# Patient Record
Sex: Female | Born: 1975 | Race: White | Hispanic: Yes | Marital: Married | Smoking: Never smoker
Health system: Southern US, Community
[De-identification: ages and names within clinical notes are randomized; demographics above are authoritative.]

---

## 2004-10-22 ENCOUNTER — Ambulatory Visit: Payer: Self-pay | Admitting: Family Medicine

## 2004-10-22 ENCOUNTER — Ambulatory Visit (HOSPITAL_COMMUNITY): Admission: RE | Admit: 2004-10-22 | Discharge: 2004-10-22 | Payer: Self-pay | Admitting: Family Medicine

## 2004-10-22 ENCOUNTER — Ambulatory Visit: Payer: Self-pay | Admitting: *Deleted

## 2004-12-24 ENCOUNTER — Ambulatory Visit: Payer: Self-pay | Admitting: Family Medicine

## 2004-12-25 ENCOUNTER — Ambulatory Visit (HOSPITAL_COMMUNITY): Admission: RE | Admit: 2004-12-25 | Discharge: 2004-12-25 | Payer: Self-pay | Admitting: Family Medicine

## 2005-08-18 ENCOUNTER — Ambulatory Visit: Payer: Self-pay | Admitting: Internal Medicine

## 2005-09-10 ENCOUNTER — Ambulatory Visit: Payer: Self-pay | Admitting: *Deleted

## 2005-11-07 ENCOUNTER — Ambulatory Visit: Payer: Self-pay | Admitting: Family Medicine

## 2006-02-24 ENCOUNTER — Ambulatory Visit: Payer: Self-pay | Admitting: Family Medicine

## 2006-03-03 ENCOUNTER — Ambulatory Visit: Payer: Self-pay | Admitting: Internal Medicine

## 2006-07-07 ENCOUNTER — Ambulatory Visit: Payer: Self-pay | Admitting: Internal Medicine

## 2007-06-25 ENCOUNTER — Encounter: Payer: Self-pay | Admitting: Family Medicine

## 2007-06-25 ENCOUNTER — Ambulatory Visit: Payer: Self-pay | Admitting: Internal Medicine

## 2007-06-29 ENCOUNTER — Ambulatory Visit (HOSPITAL_COMMUNITY): Admission: RE | Admit: 2007-06-29 | Discharge: 2007-06-29 | Payer: Self-pay | Admitting: Family Medicine

## 2007-09-02 ENCOUNTER — Ambulatory Visit (HOSPITAL_COMMUNITY): Admission: RE | Admit: 2007-09-02 | Discharge: 2007-09-02 | Payer: Self-pay | Admitting: Family Medicine

## 2007-09-23 ENCOUNTER — Ambulatory Visit: Payer: Self-pay | Admitting: Internal Medicine

## 2008-05-12 ENCOUNTER — Emergency Department (HOSPITAL_COMMUNITY): Admission: EM | Admit: 2008-05-12 | Discharge: 2008-05-12 | Payer: Self-pay | Admitting: Emergency Medicine

## 2008-06-06 ENCOUNTER — Emergency Department (HOSPITAL_COMMUNITY): Admission: EM | Admit: 2008-06-06 | Discharge: 2008-06-06 | Payer: Self-pay | Admitting: Emergency Medicine

## 2009-07-30 ENCOUNTER — Ambulatory Visit: Payer: Self-pay | Admitting: Internal Medicine

## 2009-07-30 LAB — CONVERTED CEMR LAB: Chlamydia, Swab/Urine, PCR: NEGATIVE

## 2013-11-10 ENCOUNTER — Encounter: Payer: Self-pay | Admitting: Gynecology

## 2013-11-10 ENCOUNTER — Other Ambulatory Visit (HOSPITAL_COMMUNITY)
Admission: RE | Admit: 2013-11-10 | Discharge: 2013-11-10 | Disposition: A | Discharge status: Home / Self Care | Payer: Self-pay | Source: Ambulatory Visit | Attending: Gynecology | Admitting: Gynecology

## 2013-11-10 ENCOUNTER — Ambulatory Visit (INDEPENDENT_AMBULATORY_CARE_PROVIDER_SITE_OTHER): Payer: Self-pay | Admitting: Gynecology

## 2013-11-10 VITALS — BP 120/76 | Ht 58.5 in | Wt 131.0 lb

## 2013-11-10 DIAGNOSIS — Z01419 Encounter for gynecological examination (general) (routine) without abnormal findings: Secondary | ICD-10-CM

## 2013-11-10 DIAGNOSIS — N979 Female infertility, unspecified: Secondary | ICD-10-CM | POA: Insufficient documentation

## 2013-11-10 DIAGNOSIS — Z1151 Encounter for screening for human papillomavirus (HPV): Secondary | ICD-10-CM | POA: Insufficient documentation

## 2013-11-10 LAB — CBC WITH DIFFERENTIAL/PLATELET
BASOS ABS: 0 10*3/uL (ref 0.0–0.1)
Basophils Relative: 0 % (ref 0–1)
EOS PCT: 2 % (ref 0–5)
Eosinophils Absolute: 0.1 10*3/uL (ref 0.0–0.7)
HEMATOCRIT: 37.5 % (ref 36.0–46.0)
Hemoglobin: 12.9 g/dL (ref 12.0–15.0)
LYMPHS ABS: 2.1 10*3/uL (ref 0.7–4.0)
LYMPHS PCT: 36 % (ref 12–46)
MCH: 29.3 pg (ref 26.0–34.0)
MCHC: 34.4 g/dL (ref 30.0–36.0)
MCV: 85 fL (ref 78.0–100.0)
MONO ABS: 0.3 10*3/uL (ref 0.1–1.0)
Monocytes Relative: 5 % (ref 3–12)
NEUTROS PCT: 57 % (ref 43–77)
Neutro Abs: 3.4 10*3/uL (ref 1.7–7.7)
Platelets: 223 10*3/uL (ref 150–400)
RBC: 4.41 MIL/uL (ref 3.87–5.11)
RDW: 14.8 % (ref 11.5–15.5)
WBC: 5.9 10*3/uL (ref 4.0–10.5)

## 2013-11-10 LAB — COMPREHENSIVE METABOLIC PANEL
ALBUMIN: 4.2 g/dL (ref 3.5–5.2)
ALK PHOS: 59 U/L (ref 39–117)
ALT: 36 U/L — AB (ref 0–35)
AST: 21 U/L (ref 0–37)
BUN: 9 mg/dL (ref 6–23)
CHLORIDE: 104 meq/L (ref 96–112)
CO2: 25 meq/L (ref 19–32)
CREATININE: 0.48 mg/dL — AB (ref 0.50–1.10)
Calcium: 9.2 mg/dL (ref 8.4–10.5)
Glucose, Bld: 98 mg/dL (ref 70–99)
POTASSIUM: 3.9 meq/L (ref 3.5–5.3)
SODIUM: 136 meq/L (ref 135–145)
Total Bilirubin: 0.7 mg/dL (ref 0.2–1.2)
Total Protein: 6.8 g/dL (ref 6.0–8.3)

## 2013-11-10 LAB — LIPID PANEL
CHOL/HDL RATIO: 7.3 ratio
CHOLESTEROL: 174 mg/dL (ref 0–200)
HDL: 24 mg/dL — ABNORMAL LOW (ref >39)
LDL Cholesterol: 130 mg/dL — ABNORMAL HIGH (ref 0–99)
Triglycerides: 99 mg/dL (ref <150)
VLDL: 20 mg/dL (ref 0–40)

## 2013-11-10 MED ORDER — DOXYCYCLINE HYCLATE 100 MG PO CAPS: 100.0000 mg | ORAL_CAPSULE | Freq: Two times a day (BID) | ORAL | Status: AC

## 2013-11-10 NOTE — Addendum Note (Signed)
Addended by: Berna Spare A on: 11/10/2013 12:31 PM   Modules accepted: Orders

## 2013-11-10 NOTE — Patient Instructions (Addendum)
Anlisis de semen (Semen Analysis) Este anlisis se [place] para obtener informacin sobre la salud de sus rganos reproductores, en especial si su pareja tiene problemas para quedar embarazada, o despus de una vasectoma para determinar si la operacin result exitosa. El semen es la sustancia turbia y blancuzca que se libera del pene durante la eyaculacin. Los espermatozoides son clulas del semen que tienen una cabeza y una cola que les permite desplazarse hasta el vulo. Un espermatozoide contiene una copia de cada cromosoma (todos los genes masculinos) y se fusiona con el vulo femenino, y se produce la fertilizacin.  PREPARACIN PARA EL ESTUDIO Se recolectar Truddie Coco de semen en un recipiente estril de boca ancha provisto por el laboratorio. HALLAZGOS NORMALES  Volumen: 2 a 51m  Tiempo de licuefaccin: 20 a [id number] de la recoleccin  pH: 7,12 a 8,00  Recuento de espermatozoides (densidad): 50 a 2064mlones/ml  Movilidad de los espermatozoides: 60% al 80% con movilidad activa  Morfologa de los espermatozoides: 70% al 90% con forma normal Los rangos para los resultados normales pueden variar entre diferentes laboratorios y hospitales. Consulte siempre con su mdico despus de haPsychologist, counsellingstudio para coArmed forces logistics/support/administrative officerignificado de los reAugust si los valores se consideran "dentro de los lmites normales". SIGNIFICADO DEL ESTUDIO  El mdico leer los resultados y haElectrical engineeron usted sobre la importancia y el significado de los reWashington Boroas como de las opciones de tratamiento y la necesidad de reOptometristruebas adicionales, si fuera necesario. OBTENCIN DE LOS RESULTADOS DE LAS PRUEBAS Es su responsabilidad retirar el resultado del esGrottoesConsulte en el laboratorio cundo y cmo podr obThe TJX CompaniesDocument Released: 03/09/2013 ExB[facility] Specialty Hospitalatient Information 2014 ExBradfordLLMaineHisterosalpingografa (Hysterosalpingography) La histerosalpingografa es un  procedimiento para observar el interior del tero y las trompas de FaFairfaxDurante este procedimiento, se inyecta una sustancia de contraste en el tero, a travs de la vagina y el cuello uterino para poder visualizar el tero mientras se toman imgenes radiogrficas. Este procedimiento puede ayudar al mdico a diagnosticar tumores, adherencias o anormalidades estructurales en el tero. Generalmente se utSouth Georgia and the South Sandwich Islandsara determinar las razones por las que una mujer no puede tener hijos (infertilidad). El procedimiento suele durar entre 15 y 3063inutos. INFORME A SU MDICO:  Cualquier alergia que tenga.  Todos los meUAL CorporationtSouth Daytonincluyendo vitaminas, hierbas, gotas oftlmicas, cremas y medicamentos de venta libre.  Problemas previos que usted o los miUnitedHealthe su familia hayan tenido con el uso de anestsicos.  Enfermedades de laCampbell Soup Cirugas previas.  Padecimientos mdicos. RIESGOS Y COMPLICACIONES  Generalmente es un procedimiento seguro. Sin embargo, coGames developerrocedimiento, pueden surgir complicaciones. Las complicaciones posibles son:  Infeccin en la membrana que recubre interiormente al tero (endometritis) o en las trompas de Falopio (salpingitis).  DaCoffeerompas de FaRockford Reaccin alrgica a la sustancia de coMayotteara tomar la radiografa. ANTES DEL PROCEDIMIENTO   Debe planificar el procedimiento para despus que finalice su perodo, pero antes de su prxima ovulacin. Esto ocurre por lo general enRockwell Automation y 1023e su ltimo perodo. El Da 1 es elEngineer, manufacturing systemse su perodo.  Consulte a su mdico si debe cambiar o suspender los medicamentos que toma habitualmente.  Podr comer y beber normalmente.  Antes de comenzar el procedimiento vace la vejiga. PROCEDIMIENTO  Le administrarn un medicamento para relajarse (sedante) o un analgsico de venta libre para alPublic house manageras moGrovespring  procedimiento.  Deber acostarse en una mesa de radiografas con los pies en los estribos.  Le colocarn en la vagina un dispositivo llamado espculo. Esto permite al mdico observar el interior de la vagina hasta el cuello del tero.  El cuello del tero se lava con un jabn especial.  Luego se pasa un tubo delgado y flexible a travs del cuello del tero hasta el tero.  Por el tubo se colocar una sustancia de [place].  Le tomarn varias radiografas a medida que el contraste pasa a travs del tero y las trompas de [place].  Despus del procedimiento se retira el tubo. DESPUS DEL PROCEDIMIENTO   La mayor parte de la sustancia de contraste se eliminar naturalmente. Podra ser necesario que use un apsito sanitario.  Podr sentir clicos leves.  Pregunte cuando estarn listos los [employer] Insurance. Asegrese de The TJX Companies. Document Released: 08/11/2011 Document Revised: 01/19/2013 [facility] Patient Information 2014 [place], [place]. Infertilidad (Infertility) QU ES LA INFERTILIDAD?  La infertilidad normalmente se define como la incapacidad para quedar embarazada luego de un ao de relaciones sexuales regulares sin la utilizacin de mtodos anticonceptivos. O la incapacidad de llevar a trmino Nutritional therapist y Best boy el beb. La tasa de infertilidad en los [place] es de alrededor del 10%. El embarazo es el resultado de una cadena de sucesos. La mujer debe liberar el vulo de uno de sus ovarios (ovulacin). El vulo debe fertilizarse con el esperma. Luego viaja a travs de las trompas de Falopio hacia el tero (matriz), donde se une a la pared del tero y crece. El hombre debe tener suficiente esperma y el esperma debe unirse con el vulo (fertilizar) en el momento justo. El vulo fertilizado debe luego unirse al interior del tero. Esto parece simple, pero pueden ocurrir [employer] cosas que evitan que el embarazo se produzca.  DE QUIN ES EL PROBLEMA?  Un 20%  de los casos de infertilidad se deben a problemas del hombres (factores masculinos) y un 65% se debe a problemas de la mujer (factores femeninos). Otras causas pueden ser Ardelia Mems combinacin de factores masculinos y femeninos o a causas desconocidas.  CULES SON LAS CAUSAS DE [place]?  La infertilidad en el hombre a menudo se debe a problemas para producir el esperma o hacer que el mismo llegue al vulo. Los problemas de esperma pueden existir desde el nacimiento o desarrollarse ms tarde debido a enfermedades o lesiones. Algunos hombres no producen esperma, o producen muy poco (oligospermia). Entre otros problemas se incluyen: Disfuncin sexual Problemas hormonales o endocrinos. La edad. La fertilidad del hombre disminuye con la edad, pero no tan temprano como la femenina. Infecciones. Problemas congnitos Defectos de nacimiento, como ausencia de los tubos que transportan el esperma (conductos deferentes). Problemas genticos o de cromosomas. Problemas de anticuerpos antiesperma. Eyaculacin retrgrada (el esperma va hacia la vejiga). Varicoceles, espermatoceles o tumores en los testculos. El estilo de vida puede influir en el nmero y la calidad del esperma del hombre. El alcohol y las drogas pueden reducir temporalmente la calidad del esperma. Las toxinas ambientales, como los pesticidas y el plomo, pueden ocasionar algunos casos de infertilidad en hombres. CULES SON LAS CAUSAS DE LA INFERTILIDAD EN LA MUJER?  La infertilidad en las mujeres est causada mayormente por problemas con la ovulacin. Sin ovulacin, el vulo no puede fertilizarse. Seales de problemas de ovulacin son perodos menstruales irregulares o ningn perodo. Factores simples del estilo de vida, como el estrs, la [place], o el  entrenamiento deportivo, pueden afectar el balance hormonal de Arts administrator. La edad. La fertilidad comienza a Chiropractor alrededor de los 30 aos y [place] a Proofreader de  los 46. Mucho menos a menudo, un desequilibrio hormonal por un problema mdico serio como un tumor en la glndula pituitaria, tiroides u otra enfermedad mdica crnica pueden ocasionar problemas de ovulacin. Infecciones plvicas. Sndrome de ovarios poliqusticos (aumento de hormonas masculinas, incapacidad de ovular). Consumo de alcohol o drogas. Toxinas ambientales, radiacin, pesticidas y ciertos qumicos. La edad es un factor importante en la infertilidad femenina. La capacidad de los ovarios de una mujer para producir vulos disminuye con la edad, especialmente despus de los 71 aos. Alrededor de un tercio de las parejas en las que la mujer tiene ms de 35 aos tendr problemas de infertilidad. En el momento en el que alcanza la [place], cuando su perodo menstrual se detiene, la mujer ya no puede producir vulos ni quedar embarazada. Otros problemas tambin pueden llevar a la infertilidad en las mujeres. Si las trompas de [employer] estn bloqueadas en [employer] extremos, el vulo no puede pasar a travs de los tubos [place]. Tejido cicatrizal (adhesiones) en la pelvis que pueden obstruir las trompas. Esto puede dar como resultado una enfermedad inflamatoria plvica, endometriosis o una ciruga por embarazo ectpico (en la que el vulo fertilizado se ha implantado fuera del tero) o cualquier ciruga plvica o abdominal que ocasione adherencias. Tumores fibroides o plipos en el tero. Anormalidades congnitas (de nacimiento) del tero. Infecciones en el cuello del tero (cervicitis). Estenosis cervical (estrechamiento). Mucosidad cervical anormal. Sndrome poliqustico de los ovarios. Tener relaciones sexuales muy seguidas ([place] 4 a 5 veces por semana). Obesidad. Anorexia. Dficit nutricional. Mucho ejercicio, con prdida de grasa corporal. DES. Su madre ha recibido la hormona dietilstilbesterol cuando estaba embarazada de usted. CMO SE ANALIZA LA INFERTILIDAD?   Si ha estado tratando de quedar embarazada sin xito, podra querer buscar ayuda mdica. No debera esperar un ao de intentos sin xito antes de buscar ayuda profesional si: Tiene mas de 101 aos de edad Tiene razones para creer que podra tener problemas de fertilidad. Un examen mdico determinar las causas de infertilidad de la pareja, Normalmente el proceso comienza con: Exmenes fsicos Historias clnicas de ambos miembros de la pareja. Historiales sexuales de ambos miembros de la pareja. Si no hay un problema evidente, como relaciones sexuales en tiempos inadecuados o ausencia de ovulacin, se necesitarn Training and development officer.  Para el hombre, los anlisis normalmente comienzan con pruebas de semen para observar: La cantidad de esperma. La forma del esperma. El movimiento del esperma. Realizar un historial clnico y quirrgico completo. Examen fsico. Control de infecciones en los rganos reproductivos. Podrn realizarle anlisis hormonales.  Para la mujer, el primer paso del anlisis es saber si ovula cada mes. Hay diferentes modos de Secondary school teacher. Por ejemplo, puede llevar un registro de los cambios en la temperatura corporal por la maana y la textura de la mucosidad cervical. Otra herramienta es un kit de prueba de ovulacin casero, que puede comprarse en la farmacia. Tambin pueden realizarse controles de ovulacin en el consultorio mdico, mediante anlisis de sangre para observar los niveles de hormonas o pruebas de [employer] ovarios. Si la mujer est ovulando, se necesitarn realizar ms exmenes. Algunas femeninas comunes incluyen: Histerosalpingografa: Se realizan rayos x de las trompas de Falopio y el tero con una inyeccin de [place]. Mostrar si las trompas estn abiertas y la  forma del tero. Laparoscopa: Un anlisis de las trompas y otros rganos femeninos para Risk manager. Se utiliza un tubo con iluminacin llamado laparoscopio para observar el  interior del abdomen. Biopsia endometrial: Se toma una muestra del tejido del tero Engineer, manufacturing systems del perodo menstrual, para ver si el tejido le indica si est ovulando. Prueba de ultrasonido transvaginal: Examina los rganos femeninos. Histeroscopa: Utiliza un tubo con iluminacin para examinar el cuello del tero y el tero y ver si hay anormalidades dentro del mismo. TRATAMIENTO Dependiendo de los [employer], se sugerirn Engineer, technical sales. El tratamiento depende de la causa. Entre el 57 y el 90% de los casos de infertilidad se tratan con drogas o [place].  Hay varias drogas para la fertilidad que pueden utilizarse para las mujeres con problemas de ovulacin. Es importante hablar con el profesional que la asiste sobre la droga a Risk manager. Deber comprender los beneficios y Trainer drogas. Segn el tipo de droga para la fertilidad y la dosis [place], algunas mujeres podran tener embarazos mltiples (mellizos). De ser [employer], se puede realizar una ciruga para reparar daos en los ovarios de la [place], trompas de [place], el cuello del tero o el tero. Tratamiento quirrgico o mdico para la endometriosis o el sndrome de ovario poliqustico. A veces, los problemas de fertilidad en el hombre pueden corregirse con medicamentos o [place]. Inseminacin intrauterina, IUI, de esperma en concordancia con la ovulacin. Cambios en el estilo de vida, si esta es la causa (prdida de [place], aumento de ejercicios, dejar de fumar, beber excesivamente o tomar drogas ilegales). Otros tipos de ciruga: Extirpar tumores por dentro o sobre el tero. Eliminar tejido cicatrizal de dentro del tero. Arreglar trompas obstruidas. Eliminar tejido cicatrizal en la pelvis y alrededor de los rganos femeninos. QU ES LA TECNOLOGA DE REPRODUCCIN ASISTIDA (ART)?  La tecnologa de reproduccin asistida (ART) utiliza mtodos especiales para ayudar a parejas infrtiles. En esta  tcnica se manipula tanto el vulo de la mujer como el esperma del hombre. El xito depende de muchos factores. La ART puede ser cara y demandar mucho tiempo. Pero ha hecho posible que muchas parejas tengan hijos que de otra manera no hubieran podido. A continuacin se enumeran algunos mtodos: Fertilizacin. In Vitro (FIV). In Vitro (IVF) es un procedimiento que se hizo famoso con el nacimiento en [place], el primer "beb de probeta" del mundo. Se utiliza cuando las trompas de [employer] de la mujer estn bloqueadas o cuando el hombre tiene poco esperma. Se utiliza una droga para estimular los ovarios y producir mltiples vulos. Una vez maduros, los vulos se retiran y se Copywriter, advertising en una placa de cultivo con el esperma del hombre para la fertilizacin. Luego de aproximadamente 40 horas, los vulos se examinan para ver si han sido fertilizados por el esperma y se han dividido en clulas. Esos vulos fertilizados (embriones) se colocan luego en el tero de la mujer. Esto evita el paso por las trompas de [place]. La transferencia intrafalopiana de gametas (GIFT) es similar al IVF, pero se utiliza cuando la mujer tiene al menos una trompa de falopio normal. Se colocan de tres a cinco vulos en la trompa de Falopio, junto con el esperma del hombre, para que la fertilizacin se realice dentro del cuerpo de Retail banker. La transferencia intrafalopiana de cigotos (ZIFT), tambin se denomina transferencia embrionaria, y [place] el IVF con el GIFT. Los vulos retirados de los ovarios de la mujer se Health visitor laboratorio  y se colocan en las trompas de Falopio en lugar de en el tero. Los procedimientos de ART a menudo suponen la utilizacin de donantes de vulos (de [place] mujer) o de embriones congelados previamente. Los donantes de vulos pueden utilizarse si la mujer tiene Fisher [employer] ovarios o posee una enfermedad gentica que podra transmitirla al beb. Cuando se realiza una ART hay mayor riesgo de  embarazos mltiples, mellizos, trillizos o ms. La inyeccin de esperma intracitoplasmtica es un procedimiento que inyecta un espermatozoide en el vulo para fertilizarlo. El trasplante embrionario es un procedimiento que comienza con un embrin que se ha desarrollado en un medio especial (solucin qumica) preparado para mantener el embrin vivo por 2 a 5 das, y Heritage manager. En los [employer] que no puede encontrarse la causa y Water quality scientist no se produce, podr considerarse la adopcin. Document Released: 06/08/2007 Document Revised: 08/11/2011 [facility] Patient Information 2014 [place], [place].

## 2013-11-10 NOTE — Progress Notes (Signed)
Hayley Patterson [birth year] [id number]   History:    38 y.o.  gravida 1 para 1 (cesarean section) who is a new patient to the practice. The patient had a child approximately 12 years ago and for the past 3 years has a new partner and has been unable to conceive. 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 She states that she has always had normal Pap smears. She reports normal menstrual cycles. Her new partner has had a child with a previous relationship.   Past medical history,surgical history, family history and social history were all reviewed and documented in the EPIC chart.  Gynecologic History Patient's last menstrual period was 10/17/2013. Contraception: none Last Pap: Over 2 years ago. Results were: normal Last mammogram: Not indicated. Results were: Not indicated  Obstetric History OB History  Gravida Para Term Preterm AB SAB TAB Ectopic Multiple Living  1 1        1     # Outcome Date GA Lbr Len/2nd Weight Sex Delivery Anes PTL Lv  1 PAR     F CS   Y       ROS: A ROS was performed and pertinent positives and negatives are included in the history.  GENERAL: No fevers or chills. HEENT: No change in vision, no earache, sore throat or sinus congestion. NECK: No pain or stiffness. CARDIOVASCULAR: No chest pain or pressure. No palpitations. PULMONARY: No shortness of breath, cough or wheeze. GASTROINTESTINAL: No abdominal pain, nausea, vomiting or diarrhea, melena or bright red blood per rectum. GENITOURINARY: No urinary frequency, urgency, hesitancy or dysuria. MUSCULOSKELETAL: No joint or muscle pain, no back pain, no recent trauma. DERMATOLOGIC: No rash, no itching, no lesions. ENDOCRINE: No polyuria, polydipsia, no heat or cold intolerance. No recent change in weight. HEMATOLOGICAL: No anemia or easy bruising or bleeding. NEUROLOGIC: No headache, seizures, numbness, tingling or weakness. PSYCHIATRIC: No depression, no loss of interest in  normal activity or change in sleep pattern.     Exam: chaperone present  BP 120/76  Ht 4' 10.5" (1.486 m)  Wt 131 lb (59.421 kg)  BMI 26.91 kg/m2  LMP 10/17/2013  Body mass index is 26.91 kg/(m^2).  General appearance : Well developed well nourished female. No acute distress HEENT: Neck supple, trachea midline, no carotid bruits, no thyroidmegaly Lungs: Clear to auscultation, no rhonchi or wheezes, or rib retractions  Heart: Regular rate and rhythm, no murmurs or gallops Breast:Examined in sitting and supine position were symmetrical in appearance, no palpable masses or tenderness,  no skin retraction, no nipple inversion, no nipple discharge, no skin discoloration, no axillary or supraclavicular lymphadenopathy Abdomen: no palpable masses or tenderness, no rebound or guarding Extremities: no edema or skin discoloration or tenderness  Pelvic:  [place], Urethra, Skene Glands: Within normal limits             Vagina: No gross lesions or discharge  Cervix: No gross lesions or discharge  Uterus  anteverted, normal size, shape and consistency, non-tender and mobile  Adnexa  Without masses or tenderness  Anus and perineum  normal   Rectovaginal  normal sphincter tone without palpated masses or tenderness             Hemoccult not indicated     Assessment/Plan:  38 y.o. female for annual exam was currently 38 years of age gravida 1 para 1 child with previous partner 12 years ago. Patient with unprotected intercourse would like to conceive. We  are going to schedule an HSG as well as semen analysis with her upcoming cycle. We will then see her in the office for consultation. She will be prescribed Vibramycin 100 mg to take one by mouth twice a day for 3 days starting the day before the HSG. The following labs were ordered today: CBC, comprehensive metabolic panel, lipid profile, TSH, and urinalysis along with Pap smear. The literature information on infertility was provided in Spanish as  well as on HSG and semen analysis.  Note: This dictation was prepared with  Dragon/digital dictation along withSmart phrase technology. Any transcriptional errors that result from this process are unintentional.   Ok Edwards MD, 11:40 AM 11/10/2013

## 2013-11-11 LAB — URINALYSIS W MICROSCOPIC + REFLEX CULTURE
BACTERIA UA: NONE SEEN
BILIRUBIN URINE: NEGATIVE
CASTS: NONE SEEN
Crystals: NONE SEEN
Glucose, UA: NEGATIVE mg/dL
HGB URINE DIPSTICK: NEGATIVE
Ketones, ur: NEGATIVE mg/dL
Leukocytes, UA: NEGATIVE
Nitrite: NEGATIVE
PH: 6 (ref 5.0–8.0)
PROTEIN: NEGATIVE mg/dL
SPECIFIC GRAVITY, URINE: 1.018 (ref 1.005–1.030)
Squamous Epithelial / LPF: NONE SEEN
Urobilinogen, UA: 0.2 mg/dL (ref 0.0–1.0)

## 2013-11-11 LAB — TSH: TSH: 0.695 u[IU]/mL (ref 0.350–4.500)

## 2013-11-14 ENCOUNTER — Other Ambulatory Visit: Payer: Self-pay | Admitting: Gynecology

## 2013-11-14 ENCOUNTER — Telehealth: Payer: Self-pay | Admitting: *Deleted

## 2013-11-14 DIAGNOSIS — E7800 Pure hypercholesterolemia, unspecified: Secondary | ICD-10-CM

## 2013-11-14 DIAGNOSIS — IMO0002 Reserved for concepts with insufficient information to code with codable children: Secondary | ICD-10-CM

## 2013-11-14 DIAGNOSIS — R740 Nonspecific elevation of levels of transaminase and lactic acid dehydrogenase [LDH]: Principal | ICD-10-CM

## 2013-11-14 DIAGNOSIS — R7401 Elevation of levels of liver transaminase levels: Secondary | ICD-10-CM

## 2013-11-14 NOTE — Telephone Encounter (Signed)
Per note on 11/10/13 HSG to be scheduled. Appointment on 11/22/13 @ 8:30 am out of pocket cost will be $879.00. blanca will inform patient.

## 2013-11-22 ENCOUNTER — Ambulatory Visit (HOSPITAL_COMMUNITY)
Admission: RE | Admit: 2013-11-22 | Discharge: 2013-11-22 | Disposition: A | Discharge status: Home / Self Care | Payer: Self-pay | Source: Ambulatory Visit | Attending: Gynecology | Admitting: Gynecology

## 2013-11-22 DIAGNOSIS — N979 Female infertility, unspecified: Secondary | ICD-10-CM | POA: Insufficient documentation

## 2013-11-22 DIAGNOSIS — IMO0002 Reserved for concepts with insufficient information to code with codable children: Secondary | ICD-10-CM

## 2013-11-22 LAB — CYTOLOGY - PAP

## 2013-11-22 MED ORDER — IOHEXOL 300 MG/ML  SOLN
20.0000 mL | Freq: Once | INTRAMUSCULAR | Status: AC | PRN
  Administered 2013-11-22: 20 mL

## 2014-04-03 ENCOUNTER — Encounter: Payer: Self-pay | Admitting: Gynecology

## 2015-01-06 IMAGING — RF DG HYSTEROGRAM
7 series · 7 of 7 positions shown · IV contrast (omnipaque)
Comparison: None.

FLUOROSCOPY TIME:  1 min, 30 seconds

CLINICAL DATA: Infertility

EXAM:
HYSTEROSALPINGOGRAM
TECHNIQUE: Following cleansing of the cervix and vagina with Betadine solution,
a hysterosalpingogram was performed using a 5-French
hysterosalpingogram catheter and Omnipaque 300 contrast. The patient
tolerated the examination without difficulty.

[Series 1: run · 1 of 1 slices shown (1 of 7)]
[im 1/1]
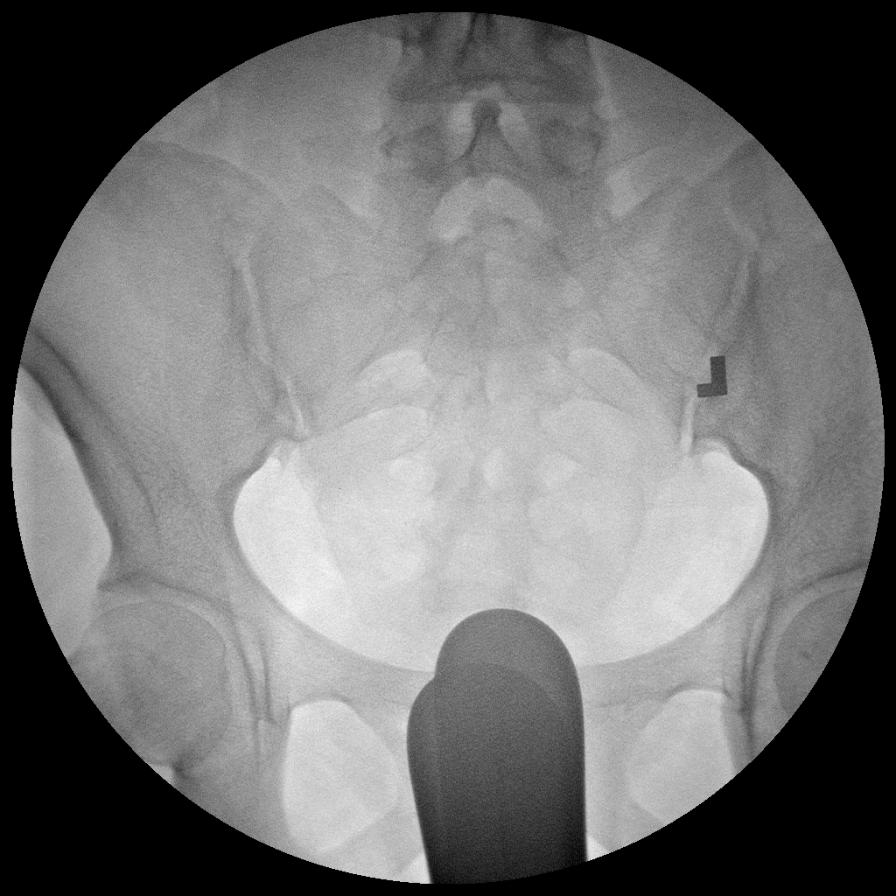

[Series 2: run · 1 of 1 slices shown (2 of 7)]
[im 1/1]
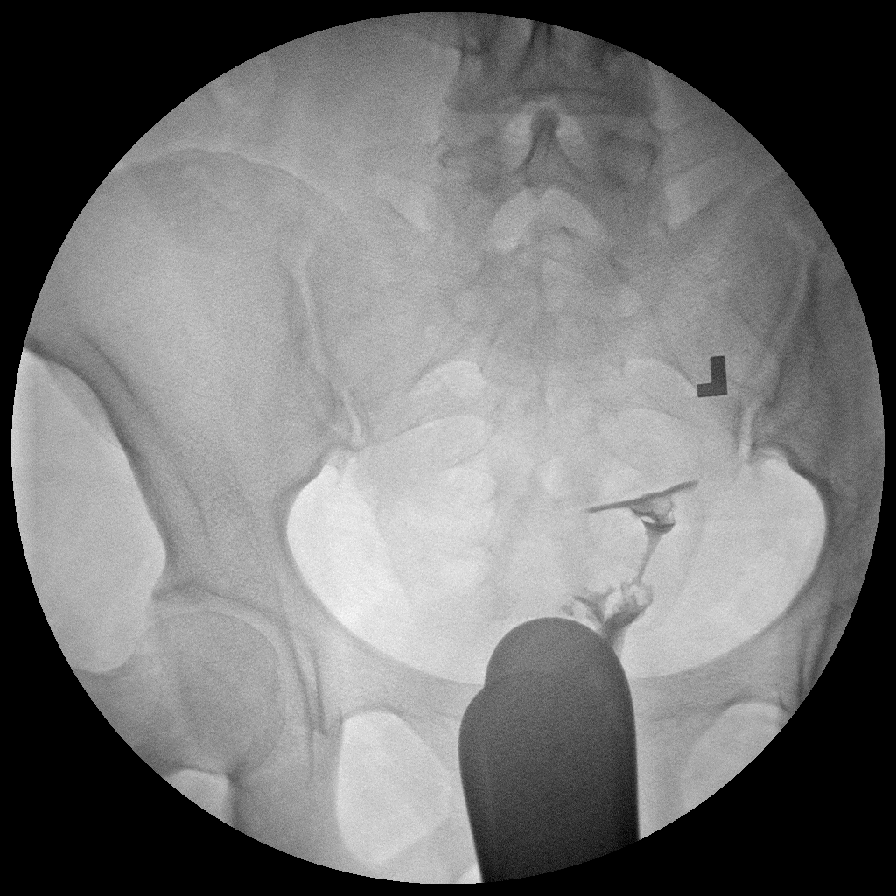

[Series 3: run · 1 of 1 slices shown (3 of 7)]
[im 1/1]
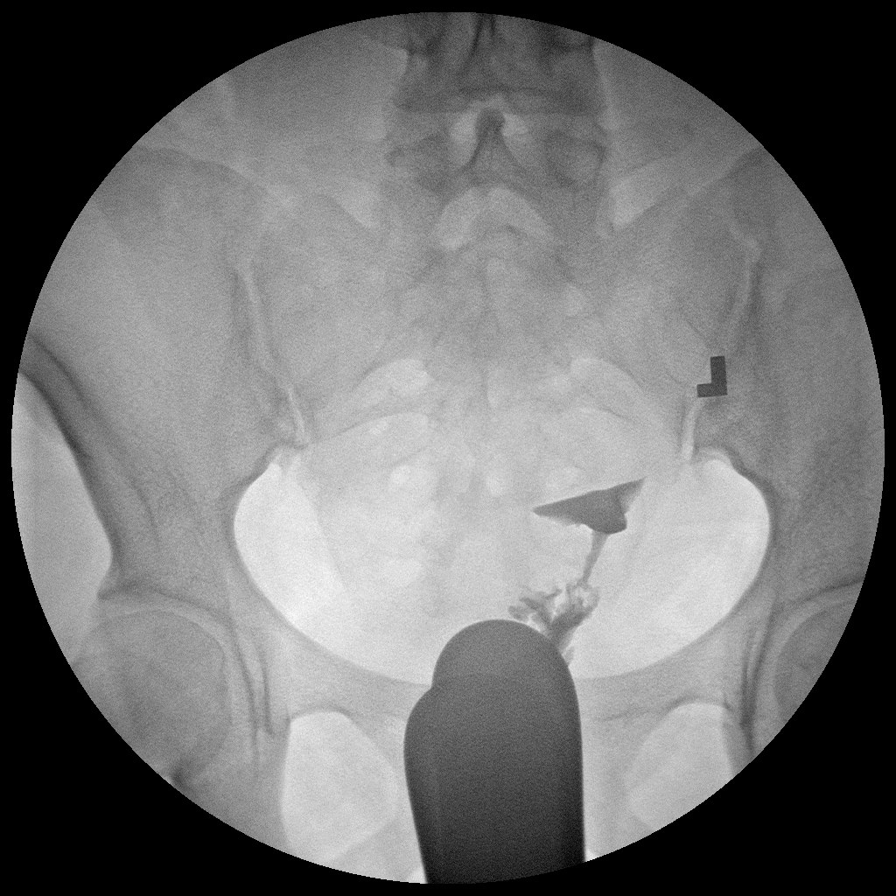

[Series 4: run · 1 of 1 slices shown (4 of 7)]
[im 1/1]
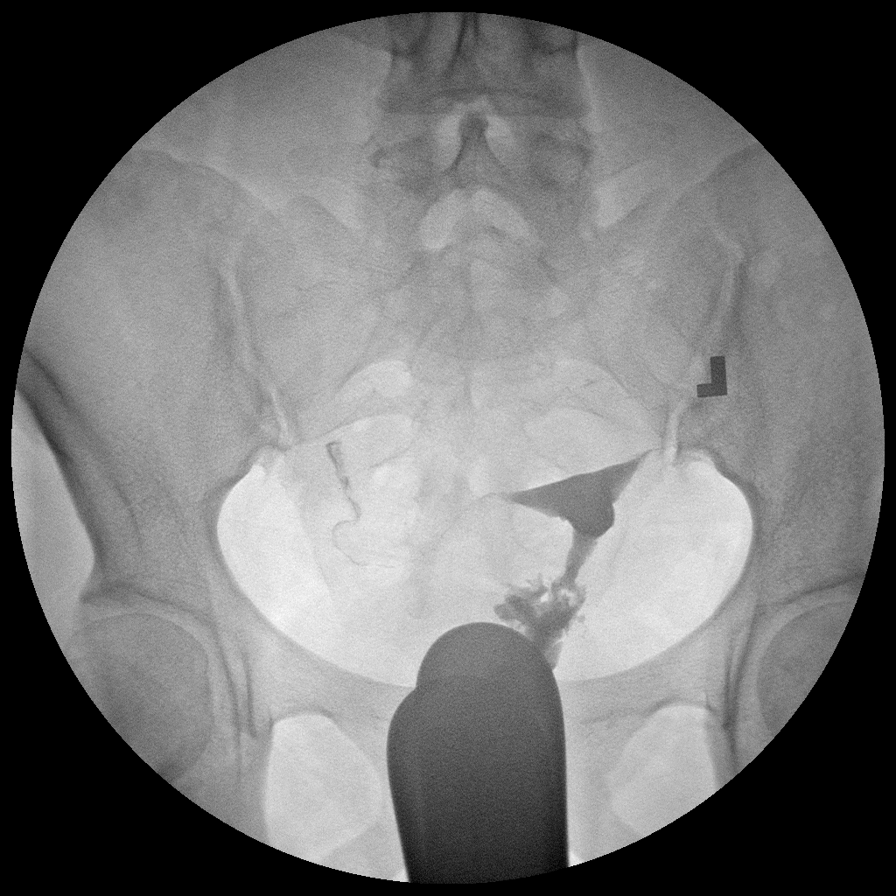

[Series 5: run · 1 of 1 slices shown (5 of 7)]
[im 1/1]
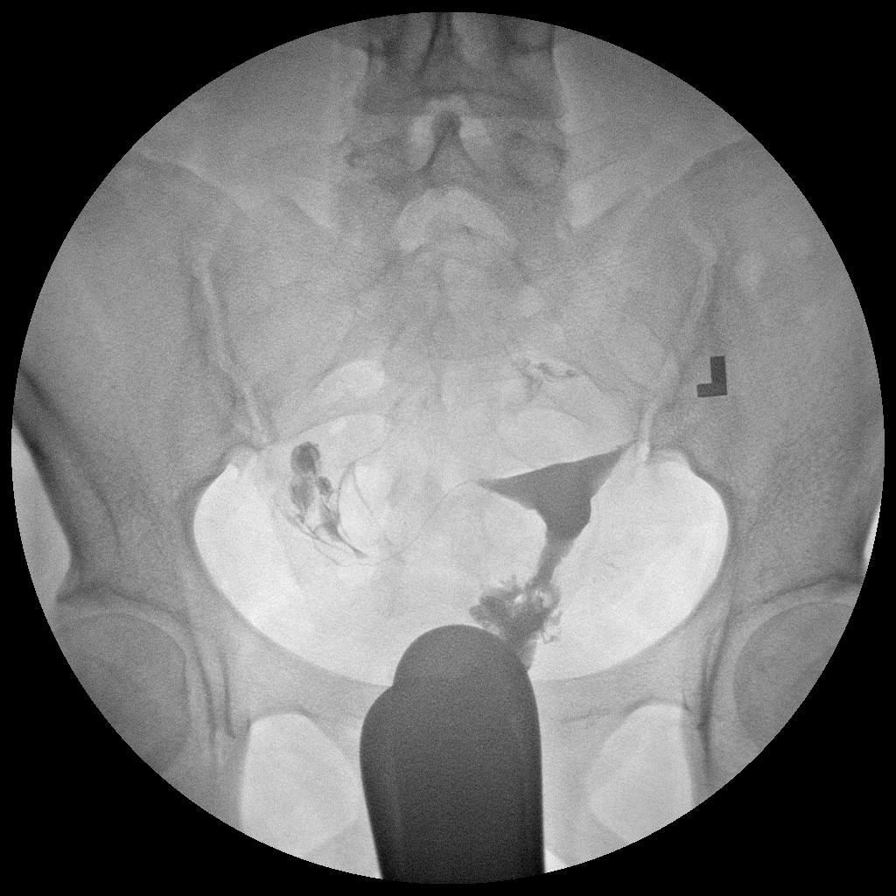

[Series 6: run · 1 of 1 slices shown (6 of 7)]
[im 1/1]
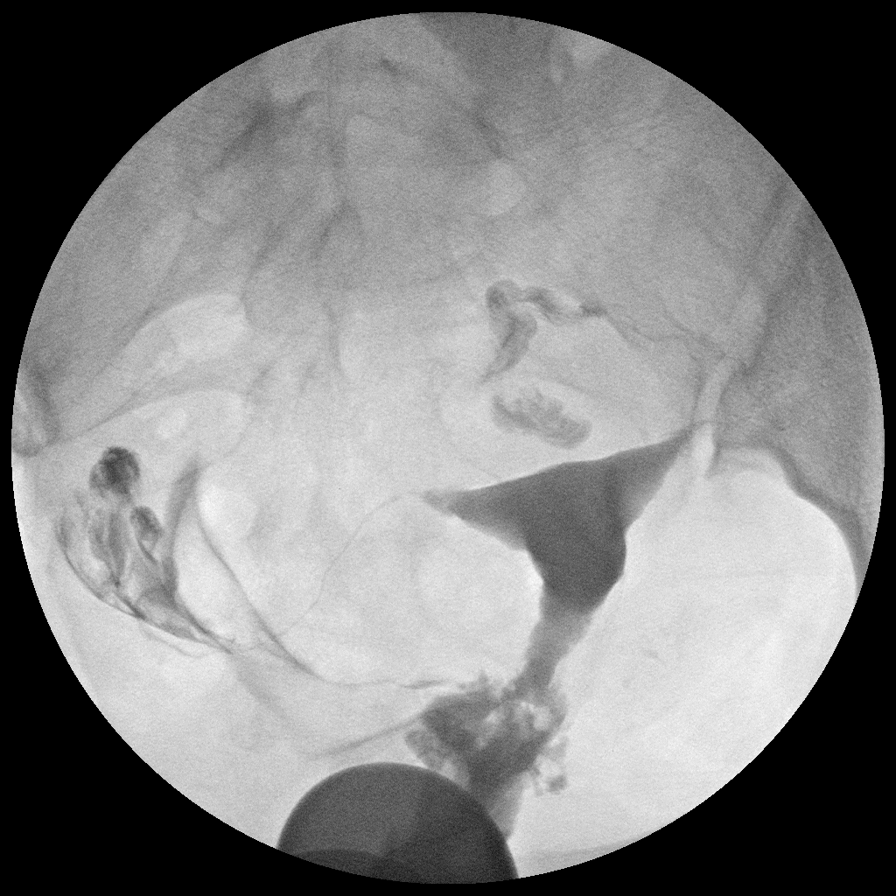

[Series 7: run · 1 of 1 slices shown (7 of 7)]
[im 1/1]
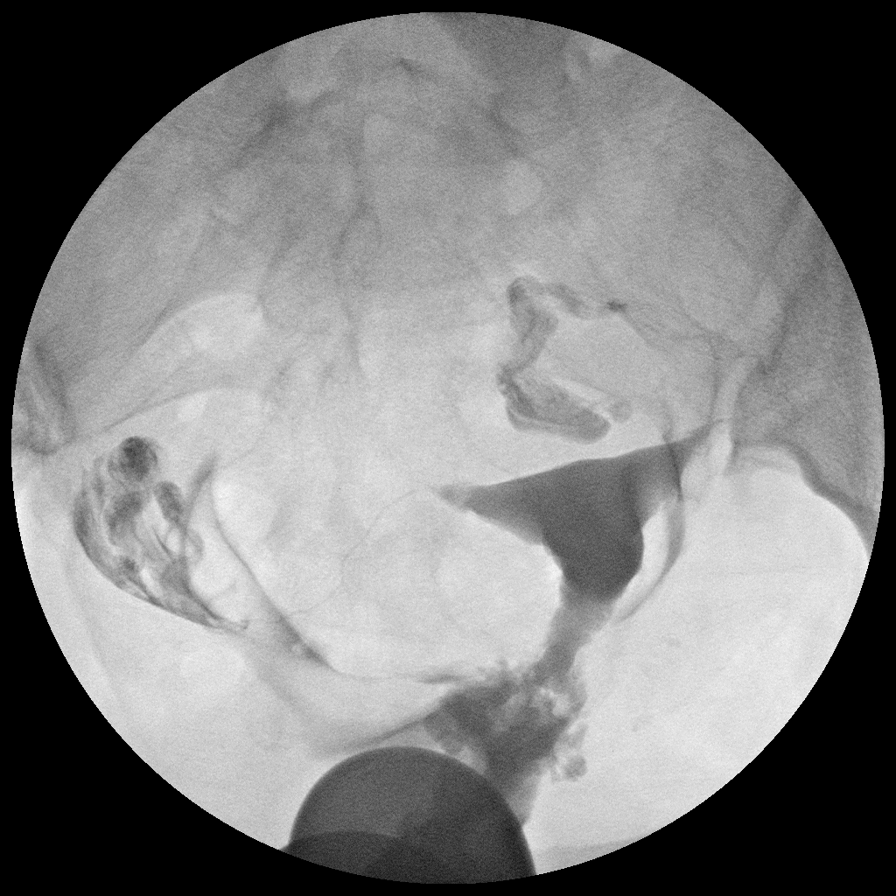

[7 of 7 positions shown; findings below may reference images not displayed]

FINDINGS: Endometrial cavity is normal. Both fallopian tubes fill and have a
normal appearance. Normal spillage bilaterally.
IMPRESSION: Unremarkable study.

## 2016-10-15 ENCOUNTER — Encounter: Payer: Self-pay | Admitting: Gynecology

## 2016-11-10 ENCOUNTER — Other Ambulatory Visit: Payer: Self-pay

## 2016-11-17 LAB — CYTOLOGY - PAP: DIAGNOSIS: NEGATIVE

## 2022-03-06 ENCOUNTER — Encounter: Payer: Self-pay | Admitting: Obstetrics and Gynecology

## 2023-11-19 ENCOUNTER — Other Ambulatory Visit: Payer: Self-pay

## 2023-11-19 DIAGNOSIS — N644 Mastodynia: Secondary | ICD-10-CM

## 2023-11-19 NOTE — Addendum Note (Signed)
 Addended by: Algie Ingle on: 11/19/2023 10:25 AM   Modules accepted: Orders

## 2023-11-23 ENCOUNTER — Ambulatory Visit: Payer: Self-pay | Attending: Obstetrics and Gynecology | Admitting: *Deleted

## 2023-11-23 ENCOUNTER — Other Ambulatory Visit: Payer: Self-pay

## 2023-11-23 VITALS — BP 127/81 | Wt 141.8 lb

## 2023-11-23 DIAGNOSIS — N6315 Unspecified lump in the right breast, overlapping quadrants: Secondary | ICD-10-CM

## 2023-11-23 DIAGNOSIS — Z01419 Encounter for gynecological examination (general) (routine) without abnormal findings: Secondary | ICD-10-CM

## 2023-11-23 DIAGNOSIS — Z1211 Encounter for screening for malignant neoplasm of colon: Secondary | ICD-10-CM

## 2023-11-23 DIAGNOSIS — N644 Mastodynia: Secondary | ICD-10-CM

## 2023-11-23 NOTE — Progress Notes (Signed)
 Ms. Nattie Lazenby is a 48 y.o. G1P1 female who presents to [facility] clinic today with complaint of left nipple pain x 1 week that comes and goes. Patient rates the pain at a 4 out of 10.    Pap Smear: Pap smear completed today. Last Pap smear was 5 years ago at a  clinic on [address] in [place] and was normal per patient. Per patient has no history of an abnormal Pap smear. Last Pap smear result is not available in Epic.   Physical exam: Breasts Breasts symmetrical. No skin abnormalities bilateral breasts. No nipple retraction bilateral breasts. No nipple discharge bilateral breasts. No lymphadenopathy. No lumps palpated left breast. Palpated a pea sized lump within the right breast at 3 o'clock  3 cm from the nipple. Complaints of bilateral outer breast tenderness on exam.     Pelvic/Bimanual Ext Genitalia No lesions, no swelling and no discharge observed on external genitalia.        Vagina Vagina pink and normal texture. No lesions or discharge observed in vagina.        Cervix Cervix is present. Cervix pink and of normal texture. No discharge observed.    Uterus Uterus is present and palpable. Uterus in normal position and normal size.        Adnexae Bilateral ovaries present and palpable. No tenderness on palpation.         Rectovaginal No rectal exam completed today since patient had no rectal complaints. No skin abnormalities observed on exam.     Smoking History: Patient has never smoked   Patient Navigation: Patient education provided. Access to services provided for patient through [employer] program. Spanish interpreter Damon Pierce from [facility] provided.   Colorectal Cancer Screening: Per patient has never had colonoscopy completed. FIT Test given to patient to complete. No complaints today.    Breast and Cervical Cancer Risk Assessment: Patient does not have family history of breast cancer, known genetic mutations, or radiation treatment to the chest  before age 70. Patient does not have history of cervical dysplasia, immunocompromised, or DES exposure in-utero.  Risk Scores as of Encounter on 11/23/2023     Alisa           5-year 0.95%   Lifetime 9.19%   This patient is [place]/Latina but has no documented birth country, so the [place] model used data from [place] patients to calculate their risk score. Document a birth country in the Demographics activity for a more accurate score.         Last calculated by Driscilla Wanda SQUIBB, RN on 11/23/2023 at 10:58 PM       A: [facility] exam with pap smear Complaint of left breast pain.  P: Referred patient to the [facility] for a diagnostic mammogram. Appointment scheduled Thursday, November 03, 2023 at 1000.  Driscilla Wanda SQUIBB, RN 11/23/2023 12:06 PM

## 2023-11-23 NOTE — Patient Instructions (Signed)
 Explained breast self awareness with Caldwell Ruffini. Pap smear completed. Let her know [facility] will cover Pap smears and HPV typing every 5 years unless has a history of abnormal Pap smears. Referred patient to the [facility] for a diagnostic mammogram. Appointment scheduled Thursday, November 03, 2023 at 1000. Patient aware of appointment and will be there. Let patient know will follow up with her within the next couple weeks with results of Pap smear by letter or phone. Hayley Patterson verbalized understanding.  Alexandra Posadas, Wanda Ship, RN 12:06 PM

## 2023-11-27 LAB — CYTOLOGY - PAP
Comment: NEGATIVE
Diagnosis: NEGATIVE
Diagnosis: REACTIVE
High risk HPV: NEGATIVE

## 2023-11-30 ENCOUNTER — Ambulatory Visit: Payer: Self-pay | Admitting: Obstetrics & Gynecology

## 2023-12-03 ENCOUNTER — Ambulatory Visit
Admission: RE | Admit: 2023-12-03 | Discharge: 2023-12-03 | Disposition: A | Discharge status: Home / Self Care | Payer: Self-pay | Source: Ambulatory Visit | Attending: Obstetrics and Gynecology | Admitting: Obstetrics and Gynecology

## 2023-12-03 DIAGNOSIS — N644 Mastodynia: Secondary | ICD-10-CM

## 2023-12-17 LAB — FECAL OCCULT BLOOD, IMMUNOCHEMICAL

## 2023-12-18 ENCOUNTER — Telehealth: Payer: Self-pay

## 2023-12-18 ENCOUNTER — Other Ambulatory Visit: Payer: Self-pay

## 2023-12-18 DIAGNOSIS — Z1211 Encounter for screening for malignant neoplasm of colon: Secondary | ICD-10-CM

## 2023-12-18 NOTE — Telephone Encounter (Signed)
 Via, Hayley Patterson, [facility] Spanish Interpreter, Patient was contacted and informed FIT Test (stool test) specimen was received at the lab, but could be processed as the specimen was too old. We could send her another FIT test kit if she prefers. Patient verbalized understanding and agreed to do another FIT test. FIT Test mailed to patient.

## 2023-12-23 NOTE — Telephone Encounter (Signed)
 12/23/2023: I have mailed pt's FIT test from the [facility] to the address on file.

## 2024-01-12 ENCOUNTER — Ambulatory Visit: Payer: Self-pay

## 2024-01-12 LAB — FECAL OCCULT BLOOD, IMMUNOCHEMICAL

## 2024-01-14 ENCOUNTER — Other Ambulatory Visit: Payer: Self-pay

## 2024-01-14 ENCOUNTER — Telehealth: Payer: Self-pay

## 2024-01-14 DIAGNOSIS — Z1211 Encounter for screening for malignant neoplasm of colon: Secondary | ICD-10-CM

## 2024-01-14 NOTE — Telephone Encounter (Signed)
 Via Alan Been, [facility] Spanish Interpreter, Patient informed the 2nd FIT test could not be performed due to delay in transport. Patient stated she was putting in outgoing office mail same day as collected. Patient informed outgoing office mail sometimes is delayed, may need to take [facility] or place in blue postal mailbox to ensure is mailed same day. Patient verbalized understanding. FIT Test kit mailed to patient.

## 2024-01-28 ENCOUNTER — Ambulatory Visit: Payer: Self-pay
